# Patient Record
Sex: Male | Born: 1965 | Race: White | Hispanic: No | Marital: Married | Smoking: Former smoker
Health system: Southern US, Community
[De-identification: ages and names within clinical notes are randomized; demographics above are authoritative.]

## PROBLEM LIST (undated history)

## (undated) DIAGNOSIS — I2510 Atherosclerotic heart disease of native coronary artery without angina pectoris: Secondary | ICD-10-CM

## (undated) DIAGNOSIS — F602 Antisocial personality disorder: Secondary | ICD-10-CM

## (undated) DIAGNOSIS — F319 Bipolar disorder, unspecified: Secondary | ICD-10-CM

## (undated) DIAGNOSIS — F329 Major depressive disorder, single episode, unspecified: Secondary | ICD-10-CM

## (undated) DIAGNOSIS — S0993XA Unspecified injury of face, initial encounter: Secondary | ICD-10-CM

## (undated) DIAGNOSIS — F4310 Post-traumatic stress disorder, unspecified: Secondary | ICD-10-CM

## (undated) DIAGNOSIS — R569 Unspecified convulsions: Secondary | ICD-10-CM

## (undated) DIAGNOSIS — E785 Hyperlipidemia, unspecified: Secondary | ICD-10-CM

## (undated) DIAGNOSIS — I10 Essential (primary) hypertension: Secondary | ICD-10-CM

## (undated) DIAGNOSIS — F1010 Alcohol abuse, uncomplicated: Secondary | ICD-10-CM

## (undated) DIAGNOSIS — F1910 Other psychoactive substance abuse, uncomplicated: Secondary | ICD-10-CM

## (undated) DIAGNOSIS — F32A Depression, unspecified: Secondary | ICD-10-CM

## (undated) DIAGNOSIS — F39 Unspecified mood [affective] disorder: Secondary | ICD-10-CM

## (undated) DIAGNOSIS — Z765 Malingerer [conscious simulation]: Secondary | ICD-10-CM

---

## 2016-01-24 ENCOUNTER — Emergency Department (HOSPITAL_COMMUNITY)

## 2016-01-24 ENCOUNTER — Emergency Department (HOSPITAL_COMMUNITY)
Admission: EM | Admit: 2016-01-24 | Discharge: 2016-01-24 | Disposition: A | Discharge status: Home / Self Care | Attending: Emergency Medicine | Admitting: Emergency Medicine

## 2016-01-24 ENCOUNTER — Encounter (HOSPITAL_COMMUNITY): Payer: Self-pay | Admitting: *Deleted

## 2016-01-24 DIAGNOSIS — Z79899 Other long term (current) drug therapy: Secondary | ICD-10-CM | POA: Insufficient documentation

## 2016-01-24 DIAGNOSIS — E785 Hyperlipidemia, unspecified: Secondary | ICD-10-CM | POA: Diagnosis not present

## 2016-01-24 DIAGNOSIS — Z87891 Personal history of nicotine dependence: Secondary | ICD-10-CM | POA: Diagnosis not present

## 2016-01-24 DIAGNOSIS — I2510 Atherosclerotic heart disease of native coronary artery without angina pectoris: Secondary | ICD-10-CM | POA: Insufficient documentation

## 2016-01-24 DIAGNOSIS — I10 Essential (primary) hypertension: Secondary | ICD-10-CM | POA: Diagnosis not present

## 2016-01-24 DIAGNOSIS — E860 Dehydration: Secondary | ICD-10-CM | POA: Diagnosis not present

## 2016-01-24 DIAGNOSIS — R569 Unspecified convulsions: Secondary | ICD-10-CM

## 2016-01-24 DIAGNOSIS — F3130 Bipolar disorder, current episode depressed, mild or moderate severity, unspecified: Secondary | ICD-10-CM | POA: Insufficient documentation

## 2016-01-24 DIAGNOSIS — Z7982 Long term (current) use of aspirin: Secondary | ICD-10-CM | POA: Diagnosis not present

## 2016-01-24 DIAGNOSIS — G40909 Epilepsy, unspecified, not intractable, without status epilepticus: Secondary | ICD-10-CM | POA: Diagnosis not present

## 2016-01-24 HISTORY — DX: Hyperlipidemia, unspecified: E78.5

## 2016-01-24 HISTORY — DX: Unspecified injury of face, initial encounter: S09.93XA

## 2016-01-24 HISTORY — DX: Malingerer (conscious simulation): Z76.5

## 2016-01-24 HISTORY — DX: Unspecified mood (affective) disorder: F39

## 2016-01-24 HISTORY — DX: Unspecified convulsions: R56.9

## 2016-01-24 HISTORY — DX: Depression, unspecified: F32.A

## 2016-01-24 HISTORY — DX: Antisocial personality disorder: F60.2

## 2016-01-24 HISTORY — DX: Atherosclerotic heart disease of native coronary artery without angina pectoris: I25.10

## 2016-01-24 HISTORY — DX: Bipolar disorder, unspecified: F31.9

## 2016-01-24 HISTORY — DX: Other psychoactive substance abuse, uncomplicated: F19.10

## 2016-01-24 HISTORY — DX: Major depressive disorder, single episode, unspecified: F32.9

## 2016-01-24 HISTORY — DX: Post-traumatic stress disorder, unspecified: F43.10

## 2016-01-24 HISTORY — DX: Alcohol abuse, uncomplicated: F10.10

## 2016-01-24 HISTORY — DX: Essential (primary) hypertension: I10

## 2016-01-24 LAB — CBC WITH DIFFERENTIAL/PLATELET
BASOS ABS: 0 10*3/uL (ref 0.0–0.1)
BASOS PCT: 0 %
EOS ABS: 0.1 10*3/uL (ref 0.0–0.7)
EOS PCT: 2 %
HCT: 39.9 % (ref 39.0–52.0)
Hemoglobin: 13.8 g/dL (ref 13.0–17.0)
LYMPHS PCT: 27 %
Lymphs Abs: 1.6 10*3/uL (ref 0.7–4.0)
MCH: 31.2 pg (ref 26.0–34.0)
MCHC: 34.6 g/dL (ref 30.0–36.0)
MCV: 90.3 fL (ref 78.0–100.0)
Monocytes Absolute: 0.6 10*3/uL (ref 0.1–1.0)
Monocytes Relative: 10 %
Neutro Abs: 3.6 10*3/uL (ref 1.7–7.7)
Neutrophils Relative %: 61 %
PLATELETS: 201 10*3/uL (ref 150–400)
RBC: 4.42 MIL/uL (ref 4.22–5.81)
RDW: 13.2 % (ref 11.5–15.5)
WBC: 6 10*3/uL (ref 4.0–10.5)

## 2016-01-24 LAB — COMPREHENSIVE METABOLIC PANEL
ALBUMIN: 4.6 g/dL (ref 3.5–5.0)
ALT: 29 U/L (ref 17–63)
ANION GAP: 9 (ref 5–15)
AST: 27 U/L (ref 15–41)
Alkaline Phosphatase: 93 U/L (ref 38–126)
BILIRUBIN TOTAL: 1.2 mg/dL (ref 0.3–1.2)
BUN: 18 mg/dL (ref 6–20)
CHLORIDE: 103 mmol/L (ref 101–111)
CO2: 23 mmol/L (ref 22–32)
Calcium: 9.1 mg/dL (ref 8.9–10.3)
Creatinine, Ser: 1.04 mg/dL (ref 0.61–1.24)
GFR calc Af Amer: >60 mL/min (ref >60)
GFR calc non Af Amer: >60 mL/min (ref >60)
GLUCOSE: 80 mg/dL (ref 65–99)
POTASSIUM: 3.8 mmol/L (ref 3.5–5.1)
SODIUM: 135 mmol/L (ref 135–145)
TOTAL PROTEIN: 7.9 g/dL (ref 6.5–8.1)

## 2016-01-24 LAB — RAPID URINE DRUG SCREEN, HOSP PERFORMED
Amphetamines: NOT DETECTED
Barbiturates: NOT DETECTED
Benzodiazepines: NOT DETECTED
Cocaine: NOT DETECTED
OPIATES: NOT DETECTED
TETRAHYDROCANNABINOL: NOT DETECTED

## 2016-01-24 LAB — URINE MICROSCOPIC-ADD ON

## 2016-01-24 LAB — URINALYSIS, ROUTINE W REFLEX MICROSCOPIC
Bilirubin Urine: NEGATIVE
GLUCOSE, UA: NEGATIVE mg/dL
Ketones, ur: >80 mg/dL — AB
LEUKOCYTES UA: NEGATIVE
Nitrite: NEGATIVE
PROTEIN: NEGATIVE mg/dL
SPECIFIC GRAVITY, URINE: 1.015 (ref 1.005–1.030)
pH: 6 (ref 5.0–8.0)

## 2016-01-24 LAB — CBG MONITORING, ED: GLUCOSE-CAPILLARY: 74 mg/dL (ref 65–99)

## 2016-01-24 MED ORDER — GABAPENTIN 300 MG PO CAPS: 300.0000 mg | ORAL_CAPSULE | Freq: Once | ORAL | Status: DC

## 2016-01-24 MED ORDER — SODIUM CHLORIDE 0.9 % IV BOLUS (SEPSIS): 1000.0000 mL | Freq: Once | INTRAVENOUS | Status: DC

## 2016-01-24 MED ORDER — GABAPENTIN 400 MG PO CAPS
800.0000 mg | ORAL_CAPSULE | Freq: Once | ORAL | Status: AC
  Administered 2016-01-24: 800 mg via ORAL
  Filled 2016-01-24: qty 2

## 2016-01-24 MED ORDER — GABAPENTIN 800 MG PO TABS: 800.0000 mg | ORAL_TABLET | Freq: Three times a day (TID) | ORAL | Status: AC

## 2016-01-24 MED ORDER — SODIUM CHLORIDE 0.9 % IV BOLUS (SEPSIS)
1000.0000 mL | Freq: Once | INTRAVENOUS | Status: AC
  Administered 2016-01-24: 1000 mL via INTRAVENOUS
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
## 2016-01-24 NOTE — ED Notes (Signed)
Pt arrived to er after having seizure type activity while at prison, pt's bunk mate advised to ems that he heard the bunk shaking for about a minute and pt fell, has laceration noted to back of head, bleeding controlled, ems reports that pt was post ictal for 10 minutes after their arrival, on arrival to er pt alert, able to answer questions,

## 2016-01-24 NOTE — Discharge Instructions (Signed)
Dehydration, Adult Dehydration is a condition in which you do not have enough fluid or water in your body. It happens when you take in less fluid than you lose. Vital organs such as the kidneys, brain, and heart cannot function without a proper amount of fluids. Any loss of fluids from the body can cause dehydration.  Dehydration can range from mild to severe. This condition should be treated right away to help prevent it from becoming severe. CAUSES  This condition may be caused by:  Vomiting.  Diarrhea.  Excessive sweating, such as when exercising in hot or humid weather.  Not drinking enough fluid during strenuous exercise or during an illness.  Excessive urine output.  Fever.  Certain medicines. RISK FACTORS This condition is more likely to develop in:  People who are taking certain medicines that cause the body to lose excess fluid (diuretics).   People who have a chronic illness, such as diabetes, that may increase urination.  Older adults.   People who live at high altitudes.   People who participate in endurance sports.  SYMPTOMS  Mild Dehydration  Thirst.  Dry lips.  Slightly dry mouth.  Dry, warm skin. Moderate Dehydration  Very dry mouth.   Muscle cramps.   Dark urine and decreased urine production.   Decreased tear production.   Headache.   Light-headedness, especially when you stand up from a sitting position.  Severe Dehydration  Changes in skin.   Cold and clammy skin.   Skin does not spring back quickly when lightly pinched and released.   Changes in body fluids.   Extreme thirst.   No tears.   Not able to sweat when body temperature is high, such as in hot weather.   Minimal urine production.   Changes in vital signs.   Rapid, weak pulse (more than 100 beats per minute when you are sitting still).   Rapid breathing.   Low blood pressure.   Other changes.   Sunken eyes.   Cold hands and feet.    Confusion.  Lethargy and difficulty being awakened.  Fainting (syncope).   Short-term weight loss.   Unconsciousness. DIAGNOSIS  This condition may be diagnosed based on your symptoms. You may also have tests to determine how severe your dehydration is. These tests may include:   Urine tests.   Blood tests.  TREATMENT  Treatment for this condition depends on the severity. Mild or moderate dehydration can often be treated at home. Treatment should be started right away. Do not wait until dehydration becomes severe. Severe dehydration needs to be treated at the hospital. Treatment for Mild Dehydration  Drinking plenty of water to replace the fluid you have lost.   Replacing minerals in your blood (electrolytes) that you may have lost.  Treatment for Moderate Dehydration  Consuming oral rehydration solution (ORS). Treatment for Severe Dehydration  Receiving fluid through an IV tube.   Receiving electrolyte solution through a feeding tube that is passed through your nose and into your stomach (nasogastric tube or NG tube).  Correcting any abnormalities in electrolytes. HOME CARE INSTRUCTIONS   Drink enough fluid to keep your urine clear or pale yellow.   Drink water or fluid slowly by taking small sips. You can also try sucking on ice cubes.  Have food or beverages that contain electrolytes. Examples include bananas and sports drinks.  Take over-the-counter and prescription medicines only as told by your health care provider.   Prepare ORS according to the manufacturer's instructions. Take sips  of ORS every 5 minutes until your urine returns to normal.  If you have vomiting or diarrhea, continue to try to drink water, ORS, or both.   If you have diarrhea, avoid:   Beverages that contain caffeine.   Fruit juice.   Milk.   Carbonated soft drinks.  Do not take salt tablets. This can lead to the condition of having too much sodium in your body  (hypernatremia).  SEEK MEDICAL CARE IF:  You cannot eat or drink without vomiting.  You have had moderate diarrhea during a period of more than 24 hours.  You have a fever. SEEK IMMEDIATE MEDICAL CARE IF:   You have extreme thirst.  You have severe diarrhea.  You have not urinated in 6-8 hours, or you have urinated only a small amount of very dark urine.  You have shriveled skin.  You are dizzy, confused, or both.   This information is not intended to replace advice given to you by your health care provider. Make sure you discuss any questions you have with your health care provider.   Document Released: 07/31/2005 Document Revised: 04/21/2015 Document Reviewed: 12/16/2014 Elsevier Interactive Patient Education 2016 [employer]  Epilepsy Epilepsy is a disorder in which a person has repeated seizures over time. A seizure is a release of abnormal electrical activity in the brain. Seizures can cause a change in attention, behavior, or the ability to remain awake and alert (altered mental status). Seizures often involve uncontrollable shaking (convulsions).  Most people with epilepsy lead normal lives. However, people with epilepsy are at an increased risk of falls, accidents, and injuries. Therefore, it is important to begin treatment right away. CAUSES  Epilepsy has many possible causes. Anything that disturbs the normal pattern of brain cell activity can lead to seizures. This may include:   Head injury.  Birth trauma.  High fever as a child.  Stroke.  Bleeding into or around the brain.  Certain drugs.  Prolonged low oxygen, such as what occurs after CPR efforts.  Abnormal brain development.  Certain illnesses, such as meningitis, encephalitis (brain infection), malaria, and other infections.  An imbalance of nerve signaling chemicals (neurotransmitters).  SIGNS AND SYMPTOMS  The symptoms of a seizure can vary greatly from one person to another. Right before a  seizure, you may have a warning (aura) that a seizure is about to occur. An aura may include the following symptoms:  Fear or anxiety.  Nausea.  Feeling like the room is spinning (vertigo).  Vision changes, such as seeing flashing lights or spots. Common symptoms during a seizure include:  Abnormal sensations, such as an abnormal smell or a bitter taste in the mouth.   Sudden, general body stiffness.   Convulsions that involve rhythmic jerking of the face, arm, or leg on one or both sides.   Sudden change in consciousness.   Appearing to be awake but not responding.   Appearing to be asleep but cannot be awakened.   Grimacing, chewing, lip smacking, drooling, tongue biting, or loss of bowel or bladder control. After a seizure, you may feel sleepy for a while. DIAGNOSIS  Your health care provider will ask about your symptoms and take a medical history. Descriptions from any witnesses to your seizures will be very helpful in the diagnosis. A physical exam, including a detailed neurological exam, is necessary. Various tests may be done, such as:   An electroencephalogram (EEG). This is a painless test of your brain waves. In this test, a diagram  is created of your brain waves. These diagrams can be interpreted by a specialist.  An MRI of the brain.   A CT scan of the brain.   A spinal tap (lumbar puncture, LP).  Blood tests to check for signs of infection or abnormal blood chemistry. TREATMENT  There is no cure for epilepsy, but it is generally treatable. Once epilepsy is diagnosed, it is important to begin treatment as soon as possible. For most people with epilepsy, seizures can be controlled with medicines. The following may also be used:  A pacemaker for the brain (vagus nerve stimulator) can be used for people with seizures that are not well controlled by medicine.  Surgery on the brain. For some people, epilepsy eventually goes away. HOME CARE INSTRUCTIONS    Follow your health care provider's recommendations on driving and safety in normal activities.  Get enough rest. Lack of sleep can cause seizures.  Only take over-the-counter or prescription medicines as directed by your health care provider. Take any prescribed medicine exactly as directed.  Avoid any known triggers of your seizures.  Keep a seizure diary. Record what you recall about any seizure, especially any possible trigger.   Make sure the people you live and work with know that you are prone to seizures. They should receive instructions on how to help you. In general, a witness to a seizure should:   Cushion your head and body.   Turn you on your side.   Avoid unnecessarily restraining you.   Not place anything inside your mouth.   Call for emergency medical help if there is any question about what has occurred.   Follow up with your health care provider as directed. You may need regular blood tests to monitor the levels of your medicine.  SEEK MEDICAL CARE IF:   You develop signs of infection or other illness. This might increase the risk of a seizure.   You seem to be having more frequent seizures.   Your seizure pattern is changing.  SEEK IMMEDIATE MEDICAL CARE IF:   You have a seizure that does not stop after a few moments.   You have a seizure that causes any difficulty in breathing.   You have a seizure that results in a very severe headache.   You have a seizure that leaves you with the inability to speak or use a part of your body.    This information is not intended to replace advice given to you by your health care provider. Make sure you discuss any questions you have with your health care provider.   Document Released: 07/31/2005 Document Revised: 05/21/2013 Document Reviewed: 03/12/2013 Elsevier Interactive Patient Education [employer]

## 2016-01-24 NOTE — ED Notes (Signed)
Report to Ms Anne HahnWillis, RN

## 2017-11-24 IMAGING — CT CT HEAD W/O CM
4 of 8 series · 16 of 47 positions shown, 18 images · non-contrast
Comparison: None.

CLINICAL DATA: Seizure in a bunk bed while asleep. Patient fell,
striking the posterior head. Left-sided shoulder pain and posterior
neck pain with headache.

EXAM:
CT HEAD WITHOUT CONTRAST
CT CERVICAL SPINE WITHOUT CONTRAST
TECHNIQUE: Multidetector CT imaging of the head and cervical spine was
performed following the standard protocol without intravenous
contrast. Multiplanar CT image reconstructions of the cervical spine
were also generated.

[Series 3: head bone · axial · 0.46mm/px · z∈[+34,+108]mm · 4 of 88 slices shown]
[im 13/88  bone]
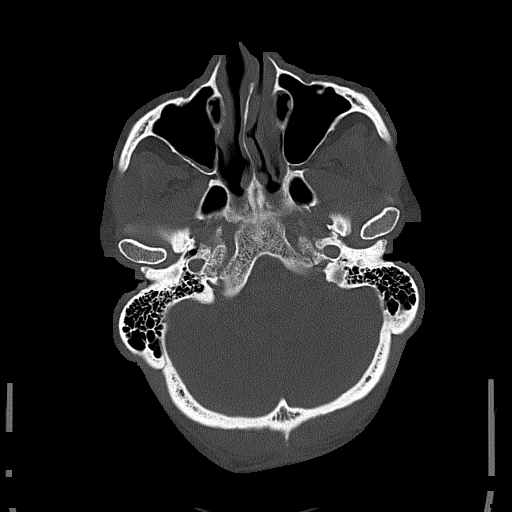
[im 25/88  bone]
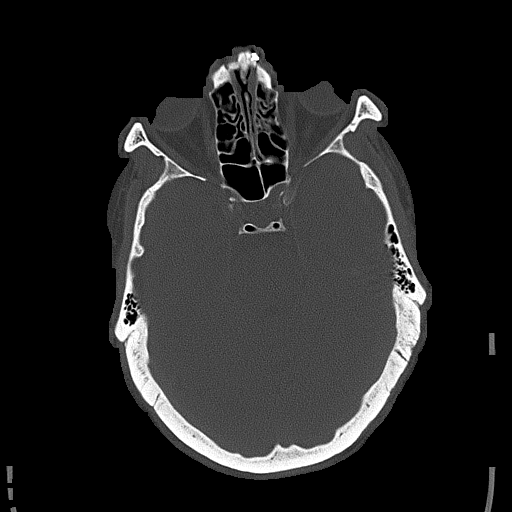
[im 38/88  bone]
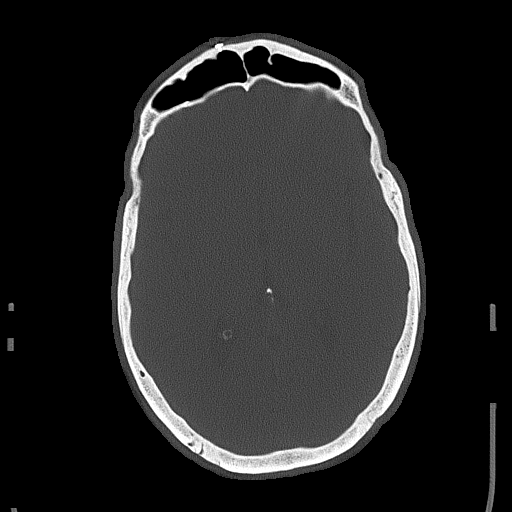
[im 50/88  bone]
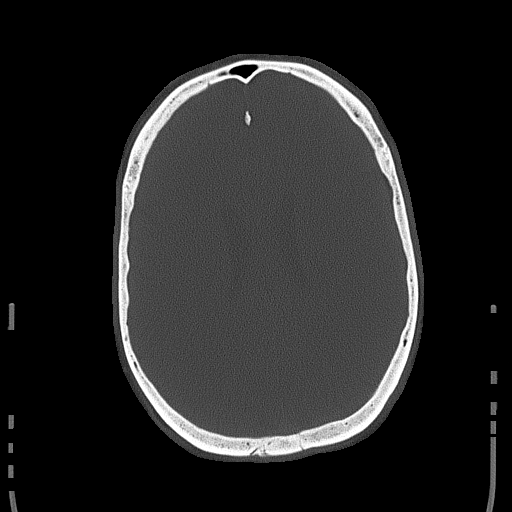

[Series 4: coronal · coronal · 0.34mm/px · 3 of 74 slices shown]
[im 28/74  brain]
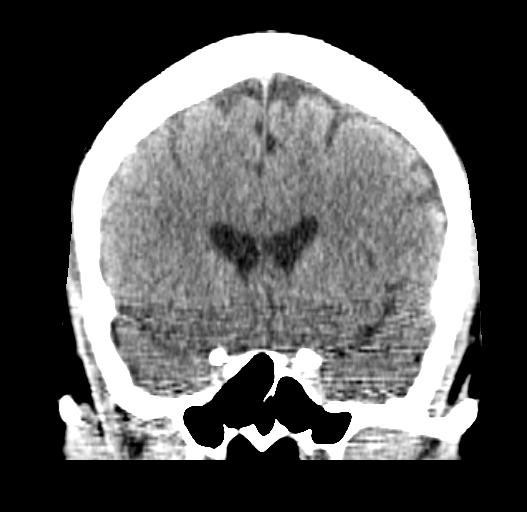
[im 37/74  brain]
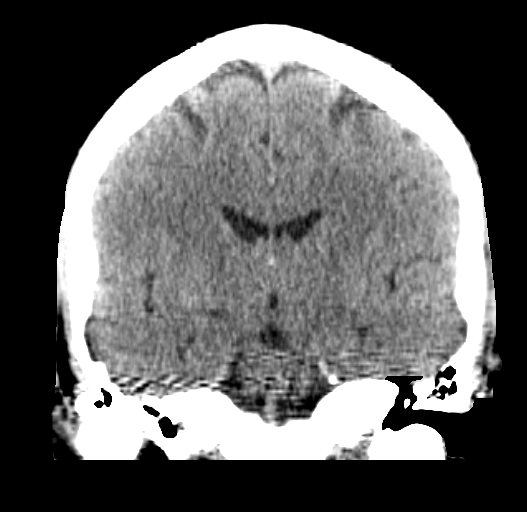
[im 46/74  brain]
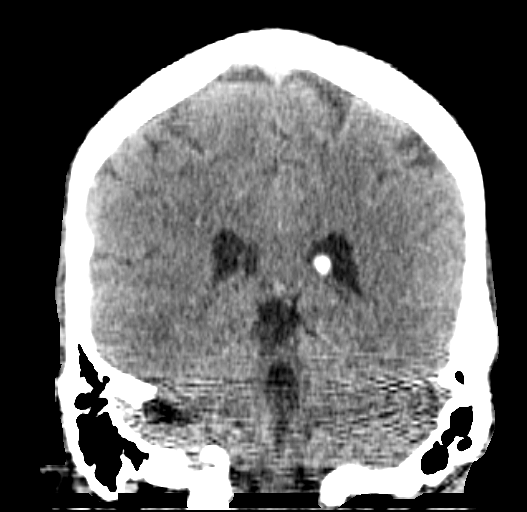

[Series 5: sagittal · sagittal · 0.36mm/px · 1 of 59 slices shown]
[im 30/59  brain]
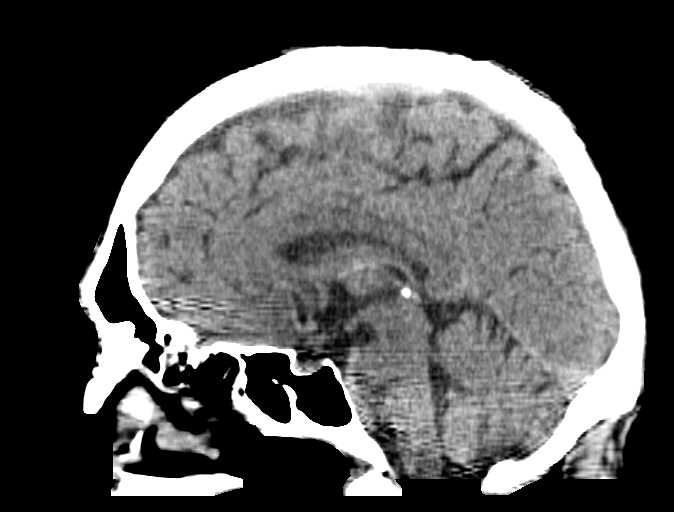

[Series 10: orthogonal axial · axial · 0.27mm/px · z∈[-142,+22]mm · 8 of 110 slices shown, 10 images]
[im 13/110  brain]
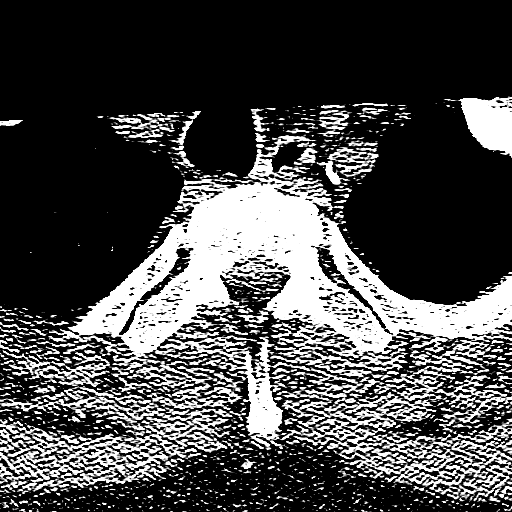
[im 13/110  bone]
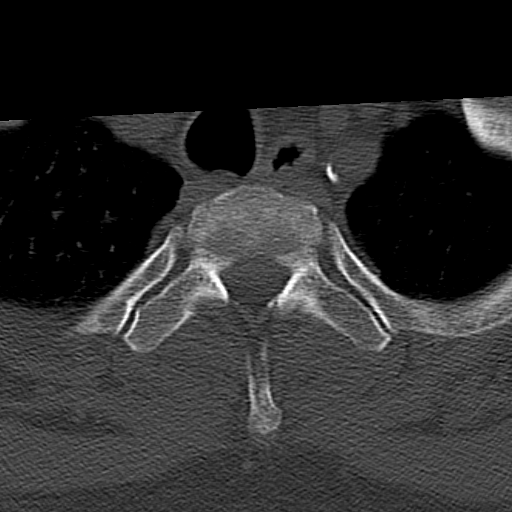
[im 25/110  brain]
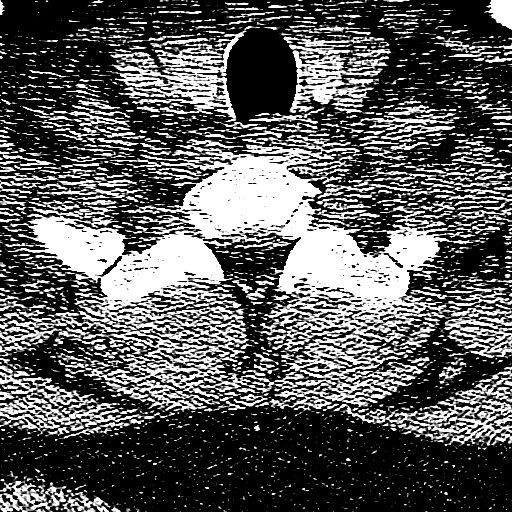
[im 37/110  brain]
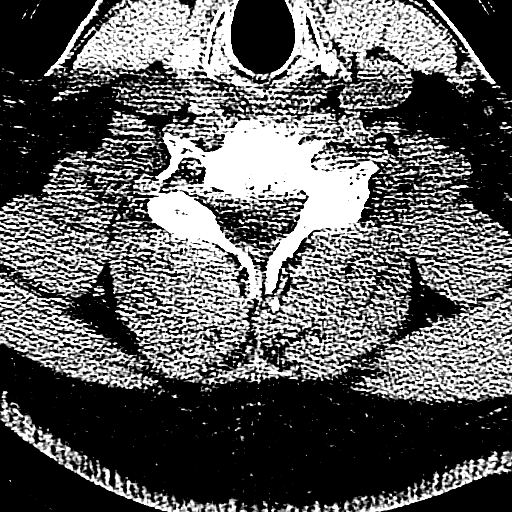
[im 49/110  brain]
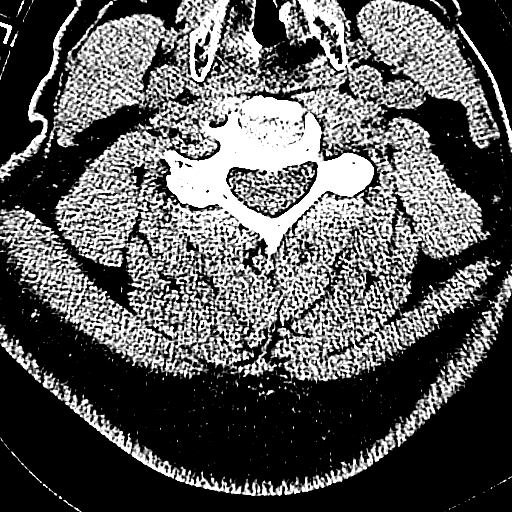
[im 61/110  brain]
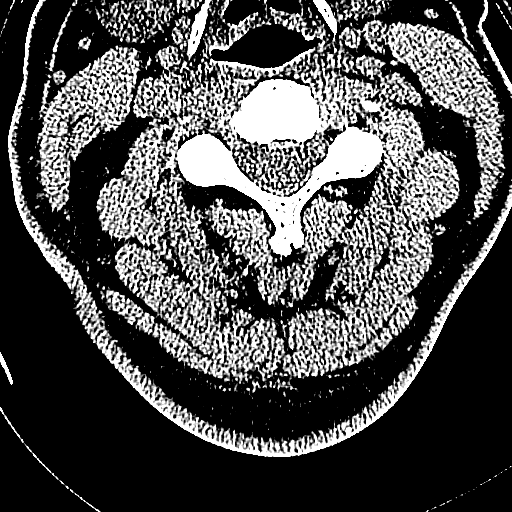
[im 61/110  bone]
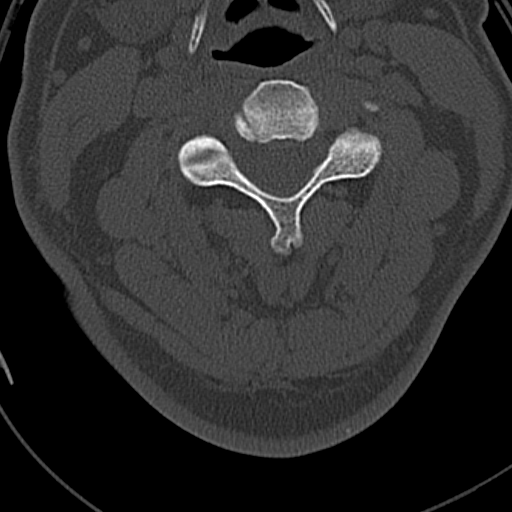
[im 73/110  brain]
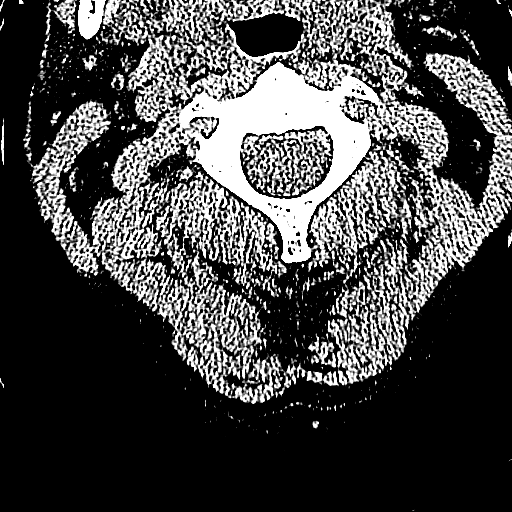
[im 85/110  brain]
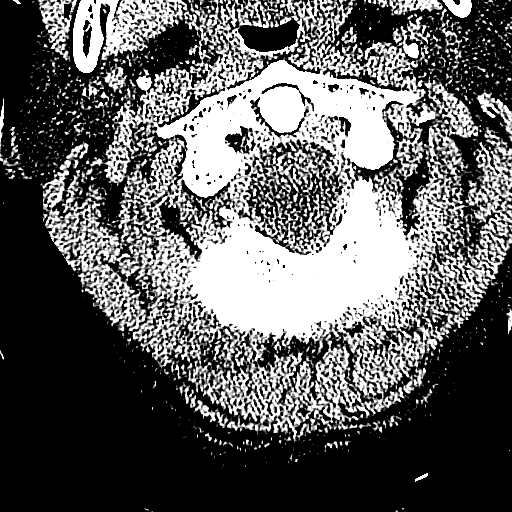
[im 97/110  brain]
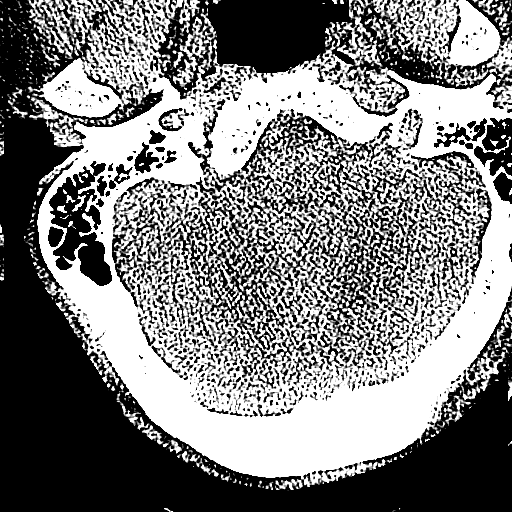

[16 of 47 positions shown; findings below may reference images not displayed]

FINDINGS: CT HEAD FINDINGS

Ventricles and sulci appear symmetrical. No ventricular dilatation.
No mass effect or midline shift. No abnormal extra-axial fluid
collections. Gray-white matter junctions are distinct. Basal
cisterns are not effaced. No evidence of acute intracranial
hemorrhage. No depressed skull fractures. Postoperative changes with
plate and screw fixation across the nasal bridge and right inferior
orbital wall. Mild mucosal thickening in the maxillary antra.
Mastoid air cells are not opacified.

CT CERVICAL SPINE FINDINGS

Normal alignment of the cervical spine. Degenerative changes in the
cervical spine with narrowed interspaces and endplate hypertrophic
changes. Probable limbus vertebra is at the inferior endplate of C4
and C5. No vertebral compression deformities. No prevertebral soft
tissue swelling. No focal bone lesion or bone destruction. C1-2
articulation appears intact. Soft tissues are unremarkable.
IMPRESSION: No acute intracranial abnormalities. Normal alignment of the
cervical spine with some degenerative changes present. No acute
displaced fractures identified.
